# Patient Record
Sex: Male | Born: 1960 | Race: Black or African American | Hispanic: No | Marital: Single
Health system: Southern US, Community
[De-identification: ages and names within clinical notes are randomized; demographics above are authoritative.]

---

## 1997-10-12 ENCOUNTER — Ambulatory Visit (HOSPITAL_COMMUNITY): Admission: RE | Admit: 1997-10-12 | Discharge: 1997-10-12 | Payer: Self-pay | Admitting: Neurosurgery

## 2003-01-21 ENCOUNTER — Emergency Department (HOSPITAL_COMMUNITY): Admission: EM | Admit: 2003-01-21 | Discharge: 2003-01-21 | Payer: Self-pay | Admitting: Emergency Medicine

## 2003-01-31 ENCOUNTER — Emergency Department (HOSPITAL_COMMUNITY): Admission: EM | Admit: 2003-01-31 | Discharge: 2003-01-31 | Payer: Self-pay | Admitting: Emergency Medicine

## 2003-02-02 ENCOUNTER — Encounter: Admission: RE | Admit: 2003-02-02 | Discharge: 2003-02-02 | Payer: Self-pay | Admitting: Occupational Medicine

## 2003-02-02 ENCOUNTER — Encounter: Payer: Self-pay | Admitting: Occupational Medicine

## 2003-02-10 ENCOUNTER — Emergency Department (HOSPITAL_COMMUNITY): Admission: EM | Admit: 2003-02-10 | Discharge: 2003-02-11 | Payer: Self-pay | Admitting: Emergency Medicine

## 2003-05-10 ENCOUNTER — Encounter: Admission: RE | Admit: 2003-05-10 | Discharge: 2003-05-10 | Payer: Self-pay | Admitting: Neurosurgery

## 2003-05-24 ENCOUNTER — Encounter: Admission: RE | Admit: 2003-05-24 | Discharge: 2003-05-24 | Payer: Self-pay | Admitting: Neurosurgery

## 2003-07-13 ENCOUNTER — Ambulatory Visit (HOSPITAL_COMMUNITY): Admission: RE | Admit: 2003-07-13 | Discharge: 2003-07-14 | Payer: Self-pay | Admitting: Neurosurgery

## 2003-12-24 ENCOUNTER — Encounter: Admission: RE | Admit: 2003-12-24 | Discharge: 2003-12-24 | Payer: Self-pay | Admitting: Neurosurgery

## 2004-01-14 ENCOUNTER — Encounter: Admission: RE | Admit: 2004-01-14 | Discharge: 2004-01-14 | Payer: Self-pay | Admitting: Neurosurgery

## 2004-01-31 ENCOUNTER — Encounter: Admission: RE | Admit: 2004-01-31 | Discharge: 2004-01-31 | Payer: Self-pay | Admitting: Neurosurgery

## 2005-06-01 HISTORY — PX: BACK SURGERY: SHX140

## 2005-08-24 ENCOUNTER — Emergency Department (HOSPITAL_COMMUNITY): Admission: EM | Admit: 2005-08-24 | Discharge: 2005-08-24 | Payer: Self-pay | Admitting: Emergency Medicine

## 2005-08-25 ENCOUNTER — Emergency Department (HOSPITAL_COMMUNITY): Admission: EM | Admit: 2005-08-25 | Discharge: 2005-08-26 | Payer: Self-pay | Admitting: Emergency Medicine

## 2005-11-10 ENCOUNTER — Ambulatory Visit (HOSPITAL_BASED_OUTPATIENT_CLINIC_OR_DEPARTMENT_OTHER): Admission: RE | Admit: 2005-11-10 | Discharge: 2005-11-10 | Payer: Self-pay | Admitting: Orthopedic Surgery

## 2005-11-16 ENCOUNTER — Emergency Department (HOSPITAL_COMMUNITY): Admission: EM | Admit: 2005-11-16 | Discharge: 2005-11-16 | Payer: Self-pay | Admitting: *Deleted

## 2006-01-13 ENCOUNTER — Ambulatory Visit (HOSPITAL_COMMUNITY): Admission: RE | Admit: 2006-01-13 | Discharge: 2006-01-13 | Payer: Self-pay | Admitting: Orthopedic Surgery

## 2006-05-18 ENCOUNTER — Encounter (INDEPENDENT_AMBULATORY_CARE_PROVIDER_SITE_OTHER): Payer: Self-pay | Admitting: *Deleted

## 2006-05-18 ENCOUNTER — Inpatient Hospital Stay (HOSPITAL_COMMUNITY): Admission: RE | Admit: 2006-05-18 | Discharge: 2006-05-21 | Payer: Self-pay | Admitting: Orthopedic Surgery

## 2006-08-16 ENCOUNTER — Encounter: Admission: RE | Admit: 2006-08-16 | Discharge: 2006-10-14 | Payer: Self-pay | Admitting: Orthopedic Surgery

## 2006-11-20 ENCOUNTER — Encounter: Admission: RE | Admit: 2006-11-20 | Discharge: 2006-11-20 | Payer: Self-pay | Admitting: Orthopedic Surgery

## 2010-06-21 ENCOUNTER — Encounter: Payer: Self-pay | Admitting: Neurosurgery

## 2010-06-22 ENCOUNTER — Encounter: Payer: Self-pay | Admitting: Orthopedic Surgery

## 2010-10-17 NOTE — Discharge Summary (Signed)
NAME:  Martin Price, Martin Price NO.:  [id number]   MEDICAL RECORD NO.:  [id number]          PATIENT TYPE:  INP   LOCATION:  5005                         FACILITY:  [facility]   PHYSICIAN:  Lianne Cure, P.A.  DATE OF BIRTH:  02/07/1961   DATE OF ADMISSION:  05/18/2006  DATE OF DISCHARGE:  05/21/2006                               DISCHARGE SUMMARY   ADMISSION DIAGNOSIS:  Lumbar spondylosis L4-5, intraforaminal disk  herniation, left side.   POSTOPERATIVE DIAGNOSIS:  Lumbar spondylosis L4-5, intraforaminal disk  herniation, left side.   OPERATIVE PROCEDURE:  L4-5 laminectomy, facet joint resection,  posterolateral fusion L3-4, L4-5, posterior interbody fusion L4-5 with  cage.   BRIEF HISTORY OF STAY:  Patient was brought in to the OR on May 18, 2006, by Dr. Nelda Severe for L3-4, L4-5, posterolateral fusion, PLIF.  Patient tolerated the surgery well.  Estimated blood loss 200 mL.  He  was stable, active range of motion in bilateral lower extremities, and  sensation grossly intact in the postoperative care unit.  At 12:03 p.m.  on May 18, 2006, postop day 1, patient was alert and oriented.  No  shortness of breath reported.  No chest pains, back pains/soreness.  He  was afebrile, and vital signs were stable.  Drain output was 275 mL  total.  Surgical procedure drain was left intact.  Calves were soft,  nontender to palpation, and palpable DT and PT pulses that were equal  bilaterally.  He had on TED hose and SCDs.  Sensation grossly intact and  equal bilaterally.  He said he had decreased leg pain postoperatively on  the left lower extremity.  IV fluids were decreased to hep lock.  He was  given Norco 10/325 every 4 hours.  PCA was discontinued.  He received 2  doses of vancomycin postoperatively dosed by pharmacy.  He had a  physical therapy evaluation for safety ambulation and mobility.  On  postop day 2, he was afebrile.  Vital signs were stable.  Drain was  discontinued; output of 50 mL.  Hemoglobin stable at 13.4.  Bilateral  lower extremities grossly neurovascular and motor intact.  Incision site  is clean and dry.  A dry dressing was applied.  Calves were soft,  nontender to palpation.  He was ambulating with standby assist in the  halls.  Postop day 3, today, he is afebrile.  Vital signs are stable.  White cell count is still slightly elevated.  Incentive spirometry is  encouraged.  Chief complaint is no significant bowel movement, although  he get a Fleet enema and suppository with minimal results.  Today we are  going to give him a dose of mag citrate and have him ambulate.  Plan on  him going home.   DISCHARGE DIAGNOSIS:  Status post L3-4, L4-5 posterolateral interbody  fusion with laminectomy.   DISCHARGE INSTRUCTIONS:  His instructions are to walk for exercise.  He  may shower as tolerated.  We do not want him to do any bending or  stooping and he is going to follow with [place] in the office with Dr.  Casimiro Needle  Tooke in 4 weeks.  If he has any troubles or concerns, he is  welcome to call us.   DISCHARGE MEDICATIONS:  I am going to send him home on prescriptions  for:  1. Norco 10/325 one to two every 4 hours p.r.n. for pain.  2. Robaxin 500 one  every 6 hours for muscle spasms.  3. Colace 100 mg twice daily.  4. Chantix 2 tablets a day to help him stop smoking.   CONDITION ON DISCHARGE:  He is being discharged in stable condition.   DIET:  Regular diet.      Lianne Cure, P.A.     MC/MEDQ  D:  05/21/2006  T:  05/21/2006  Job:  [id number]

## 2010-10-17 NOTE — Op Note (Signed)
NAME:  Martin Price, LEADBETTER NO.:  [id number]   MEDICAL RECORD NO.:  [id number]          PATIENT TYPE:  INP   LOCATION:  5005                         FACILITY:  [facility]   PHYSICIAN:  Nelda Severe, MD      DATE OF BIRTH:  Oct 08, 1960   DATE OF PROCEDURE:  05/18/2006  DATE OF DISCHARGE:                               OPERATIVE REPORT   SURGEON:  Nelda Severe, MD.   ASSISTANT:  Lianne Cure, P.A.-C.   PREOPERATIVE DIAGNOSIS:  Lumbar spondylosis, L4-5 intraforaminal disk  herniation left side.   POSTOPERATIVE DIAGNOSIS:  Lumbar spondylosis, L4-5 intraforaminal disk  herniation left side.   OPERATIVE PROCEDURE:  L4-5 laminectomy and facette joint resection left  side, disk excision (extruded fragment); posterior interbody fusion L4-5  with cage, InFuse and autograft; posterolateral fusion L3-4, L4-5 right  side; insertion of segmental screws and rods L3-4, L4-5 (__________);  harvest of local bone graft.   PROCEDURE:  The patient was placed under general anesthetic.  A gram of  vancomycin was infused intravenously.  A Foley catheter was placed in  the bladder.  Bilateral sequential compression devices were placed on  both lower extremities.   He was positioned prone on the Hessmer frame.  Care was taken to  position the upper extremities so as to avoid hyperflexion and abduction  of the shoulders and so as to avoid any pressure whatsoever on the  cubital tunnels.  Both upper extremities were padded from axilla to  hands with foam.  The hips were relatively extended, the thighs, knees  and shins were supported on pillows.   The lumbar area was prepped with DuraPrep and draped in rectangular  fashion.  The drapes were secured with Ioban.   A midline incision was made from approximately L3-S1.  Paraspinal  muscles were reflected bilaterally using Cobb elevators and cutting  current.  Cross-table lateral radiograph was taken with a Kocher on the  trailing edge of  the lamina of the L4 vertebra which conformed to our  impression of level identification.   We made pedicle holes bilaterally at L3, L4 and L5, in the same fashion  at each level.  Junction of the superior articular process and  transverse process was identified.  A small piece of bone was removed at  the base of the superior articular process.  The pedicle was perforated  with an awl and a 3.5 mm drill bit used to make a hole through the  pedicle into the vertebral body.  There hole was then carefully palpated  with a ball-tip probe to make sure it was circumferentially intact.  The  hole was sounded for depth and the depths recorded.  FloSeal was  injected into each hole and then a radio-opaque marker placed.  Cross-  table lateral radiograph was taken which showed satisfactory position of  the markers.   Next, we performed a left-sided laminectomy at L4-5 carefully harvesting  bone.  Complete facetectomy was performed.  We then removed ligamentum  flavum medially.  The disk herniation could be identified laterally and  the medial edge of the dura and a very  large L5 nerve root.  The nerve  root was retracted medially.  The disk was incised and several  moderately large pieces of degenerated nucleus pulposus were extricated  from deep to the posterior longitudinal ligament, distal to the disk  space itself.   Next, we packed the cottonoid patty subjacent to the exiting L4 nerve  root and just above the L4-5 disk space.  A nerve root  retractor was  used to retract the dura and the L5 nerve root medially.  A large window  was cut with a 15 blade in the posterolateral disk.  We then set about  preparation of the disk space for insertion of a cage eventually.  After  we curetted it multiple times and removed as much disk space as  possible, we then inserted screws bilaterally at S1, compression  bilaterally at L5, L4 and L3.  These were 6.5 mm screws with depths  being dependent on  measurements.   We then decorticated the transverse processes on the right side at L4,  L5 as well as at L3.  We then packed in a burrito made with a BMP soaked  collagen sponge and morsels of bone graft posterolaterally on the right  side.   Subsequent to the screws being inserted, precontoured rod was attached  on the right side and provisionally coupled with distraction placed on  the L4-5 interspace to hold the disk space open.   We then returned to the left side.  We used a 12 mm and then a 13 mm box  chisel to prepare a place for a 13 mm x 11 mm x 24 mm interbody cage.  The implant itself was then packed with a piece of BMP soaked collagen  sponge and impacted into place and countersunk.  Prior to placing the  cage, we packed morselized autograft harvested locally into the disk  space anteriorly.   Next, having placed the screws on the left side, a precontoured rod was  placed.  The screws were loosened off on the right side.   Next, we decorticated the lamina on the right side at L3, L4 and L5 and  carefully resected the articular surfaces of facette joints at L3-4 and  L4-5 on the right side.  We then packed in local bone graft and placed a  strip of BMP soaked collagen sponge superficial to that.  The lamina at  L3-4 was decorticated on the left side and the articular surfaces  removed at the L3-4 facette joint.  More local bone graft and BMP was  packed into this area.   We then torqued the L5 screws bilaterally.  The L4-5 and L3-4 levels  were then torqued after compression of the construct to maximize the  amount of lordosis.   Cross-table lateral radiograph showed satisfactory position of all  screws and the interbody implant.   A Blake drain, 15-gauge, was placed in the deep layer of the wound.  It  was brought out through a stab wound to the right side and secured with  a #2-0 nylon suture in basket weave fashion.  The lumbar fascia was closed using continuous #1  Vicryl suture.  The subcutaneous tissue was  closed using a continuous 2-0 Vicryl suture.  The skin was closed using  continuous subcuticular 3-0 undyed Vicryl suture.  The skin edges were  reinforced with Steri-Strips.  A nonadherent antibiotic ointment  dressing was then applied and secured with OpSite.   The patient was transferred from  the table to the bed and brought to the  postop recovery room where he is currently awake and able to move both  feet and ankles to command.   ESTIMATED BLOOD LOSS:  200 mL.   COMPLICATIONS:  There no intraoperative complications.   Sponge and needle counts were correct.      Nelda Severe, MD  Electronically Signed     MT/MEDQ  D:  05/18/2006  T:  05/18/2006  Job:  [id number]

## 2010-10-17 NOTE — H&P (Signed)
NAME:  Martin Price, Martin Price NO.:  [id number]   MEDICAL RECORD NO.:  [id number]          PATIENT TYPE:  INP   LOCATION:  NA                           FACILITY:  [facility]   PHYSICIAN:  Nelda Severe, MD      DATE OF BIRTH:  10/21/1960   DATE OF ADMISSION:  12/08/2005  DATE OF DISCHARGE:  12/08/2005                              HISTORY & PHYSICAL   CHIEF COMPLAINT:  Lumbar pain, left leg pain, radiculopathy, otherwise  he is healthy.   He has had two previous back surgeries in 2002 and 1998 at [facility].   He has no known drug allergies.   CURRENT MEDICATIONS:  Include:  1. Ambien 10 mg one q.h.s. p.r.n. for sleep.  2. Vicodin 5/500 one to two q.6. p.r.n. for pain control.   FAMILY HISTORY:  His father had hypertension, diabetes and is deceased  secondary to an MI.   REVIEW OF SYSTEMS:  He reports no recent weight loss, weight gain.  He  reports no vision changes, no hearing changes, no hoarseness, no chronic  cough, no difficulty swallowing, no fevers, no chills, no nausea,  vomiting, diarrhea, no frequent or burning urination, no blackouts, no  seizures, no migraines.   VITALS:  On physical exam today, his temperature is 97.6, pulse 78,  respirations 20, blood pressure 89/59.  GENERAL:  He appears well  developed, well nourished, normocephalic.  HEENT:  Pupils are equal, round and reactive to light.  NECK:  Supple, nontender to palpation, full range of motion.  CHEST:  Clear to auscultation, no wheezes noted.  HEART:  Regular rate and rhythm, no murmurs noted.  ABDOMEN:  Soft, nontender to palpation, positive bowel sounds are  auscultated.  EXTREMITIES:  Left leg pain, lumbar pain.  He has slight decreased left  knee jerks compared to the right.  SKIN:  Clean, dry and intact, no rashes noted.   X-rays and MRIs were done, show lumbar degenerative disc.   PLAN:  Posterior lumbar disc excision with fusion at L3-4, 4-5 by Dr.  Nelda Severe.      Lianne Cure, P.A.      Nelda Severe, MD  Electronically Signed    MC/MEDQ  D:  05/14/2006  T:  05/15/2006  Job:  [id number]
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
## 2010-10-17 NOTE — Op Note (Signed)
NAME:  Martin Price, Martin Price                      ACCOUNT NO.:  [id number]   MEDICAL RECORD NO.:  [id number]                   PATIENT TYPE:  OIB   LOCATION:  3039                                 FACILITY:  [facility]   PHYSICIAN:  Kathaleen Maser. Pool, M.D.                 DATE OF BIRTH:  1961/05/17   DATE OF PROCEDURE:  07/13/2003  DATE OF DISCHARGE:                                 OPERATIVE REPORT   SERVICE:  Neurosurgery.   PREOPERATIVE DIAGNOSES:  Right L3-L4 extraforaminal herniated nucleus  pulposus with radiculopathy.   POSTOPERATIVE DIAGNOSIS:  Right L3-L4 extraforaminal herniated nucleus  pulposus with radiculopathy.   PROCEDURE:  Right L3-L4 laminotomy and microdiscectomy.   SURGEON:  Kathaleen Maser. Pool, M.D.   ASSISTANT:  Tia Alert, M.C.   ANESTHESIA:  General endotracheal.   INDICATIONS FOR PROCEDURE:  [name] is a 50 year old male with a  history of right lower extremity pain, paresthesias and weakness consistent  with a right sided L3 radiculopathy which has failed conservative  management.  Workup demonstrates of a moderately large right sided L3-L4  extraforaminal disc herniation with marked impression of the right sided L3  nerve root.  The patient has failed conservative management and presents now  for extraforaminal microdiscectomy in hopes of improving his symptoms.   DESCRIPTION OF PROCEDURE:  The patient was taken to the operating room and  placed on the table in a supine position.  After an adequate level of  anesthesia was achieved, the patient was positioned prone on the Wilson  frame and appropriately padded for operation in the lumbar region.  He was  prepped and draped sterilely.  A skin incision was made overlying the L3-L4  interspace.  This was carried down sharply in the midline.  Subperiosteal  dissection was performed on the right side exposing the lamina, facet  joints, and transverse processes of L3 and L4.  Deep self-retaining  retractors were  placed.  Interoperative x-ray was taken and the level was  confirmed.   An extraforaminal approach was then performed using the high speed drill and  Kerrison rongeurs to remove the lateral aspect of the superior facet of L4  and the inferior facet of L3.  Intertransverse ligament was then elevated  and resected in a piecemeal fashion using Kerrison rongeurs.  The underlying  right sided L3 nerve root and disc herniation were apparent.  The microscope  was brought onto the field and using microdissection of the disc herniation  and overlying nerve root.  The epidural venous plexus coagulated and cut.  A  moderately large free fragment of disc was encountered and dissected free.  This was resected completely using pituitary rongeurs.  All elements of the  disc herniation was completely resected.  The interspace was entered and all  loose disc material was removed.  At this point, a very thorough  extraforaminal microdiscectomy had been performed.  There was no  evidence of  injury to the thecal sac or nerve roots.  There was no evidence of any  residual compression.  The wound was then irrigated with antibiotic  solution, Gelfoam was placed topically and hemostasis was found to be good.  The microscope and retractors were removed.  Hemostasis in the muscle was  achieved with electrocautery.  The wounds  were closed in layers with Vicryl sutures.  Steri-Strips and sterile  dressing were applied.  There were no complications.  The patient tolerated  the procedure well and he was returned to the recovery room in stable  condition.                                               Henry A. Pool, M.D.    HAP/MEDQ  D:  07/13/2003  T:  07/13/2003  Job:  [id number]

## 2011-02-26 ENCOUNTER — Other Ambulatory Visit: Payer: Self-pay | Admitting: Orthopedic Surgery

## 2011-02-26 DIAGNOSIS — M542 Cervicalgia: Secondary | ICD-10-CM

## 2011-02-26 DIAGNOSIS — M545 Low back pain: Secondary | ICD-10-CM

## 2011-02-26 DIAGNOSIS — M79604 Pain in right leg: Secondary | ICD-10-CM

## 2011-02-26 DIAGNOSIS — M25519 Pain in unspecified shoulder: Secondary | ICD-10-CM

## 2011-03-04 ENCOUNTER — Ambulatory Visit
Admission: RE | Admit: 2011-03-04 | Discharge: 2011-03-04 | Disposition: A | Discharge status: Home / Self Care | Payer: Medicare Other | Source: Ambulatory Visit | Attending: Orthopedic Surgery | Admitting: Orthopedic Surgery

## 2011-03-04 DIAGNOSIS — M542 Cervicalgia: Secondary | ICD-10-CM

## 2011-03-04 DIAGNOSIS — M79604 Pain in right leg: Secondary | ICD-10-CM

## 2011-03-04 DIAGNOSIS — M25519 Pain in unspecified shoulder: Secondary | ICD-10-CM

## 2011-03-04 DIAGNOSIS — M545 Low back pain, unspecified: Secondary | ICD-10-CM

## 2011-03-04 MED ORDER — IOHEXOL 300 MG/ML  SOLN
10.0000 mL | Freq: Once | INTRAMUSCULAR | Status: AC | PRN
  Administered 2011-03-04: 10 mL via INTRATHECAL

## 2011-03-04 MED ORDER — MEPERIDINE HCL 100 MG/ML IJ SOLN
75.0000 mg | Freq: Once | INTRAMUSCULAR | Status: AC
  Administered 2011-03-04: 75 mg via INTRAMUSCULAR

## 2011-03-04 MED ORDER — ONDANSETRON HCL 4 MG/2ML IJ SOLN
4.0000 mg | Freq: Once | INTRAMUSCULAR | Status: AC
  Administered 2011-03-04: 4 mg via INTRAMUSCULAR

## 2011-03-04 MED ORDER — DIAZEPAM 5 MG PO TABS
10.0000 mg | ORAL_TABLET | Freq: Once | ORAL | Status: AC
  Administered 2011-03-04: 10 mg via ORAL

## 2011-03-04 NOTE — Progress Notes (Signed)
Pt called ride to come and get him.

## 2012-08-21 ENCOUNTER — Encounter (HOSPITAL_COMMUNITY): Payer: Self-pay | Admitting: *Deleted

## 2012-08-21 ENCOUNTER — Emergency Department (HOSPITAL_COMMUNITY)
Admission: EM | Admit: 2012-08-21 | Discharge: 2012-08-21 | Disposition: A | Discharge status: Home / Self Care | Payer: Medicare Other | Attending: Emergency Medicine | Admitting: Emergency Medicine

## 2012-08-21 ENCOUNTER — Emergency Department (HOSPITAL_COMMUNITY): Payer: Medicare Other

## 2012-08-21 DIAGNOSIS — S91009A Unspecified open wound, unspecified ankle, initial encounter: Secondary | ICD-10-CM | POA: Insufficient documentation

## 2012-08-21 DIAGNOSIS — S39012A Strain of muscle, fascia and tendon of lower back, initial encounter: Secondary | ICD-10-CM

## 2012-08-21 DIAGNOSIS — W540XXA Bitten by dog, initial encounter: Secondary | ICD-10-CM

## 2012-08-21 DIAGNOSIS — Z23 Encounter for immunization: Secondary | ICD-10-CM | POA: Insufficient documentation

## 2012-08-21 DIAGNOSIS — S81851A Open bite, right lower leg, initial encounter: Secondary | ICD-10-CM

## 2012-08-21 DIAGNOSIS — F17200 Nicotine dependence, unspecified, uncomplicated: Secondary | ICD-10-CM | POA: Insufficient documentation

## 2012-08-21 DIAGNOSIS — Y92410 Unspecified street and highway as the place of occurrence of the external cause: Secondary | ICD-10-CM | POA: Insufficient documentation

## 2012-08-21 DIAGNOSIS — Y9301 Activity, walking, marching and hiking: Secondary | ICD-10-CM | POA: Insufficient documentation

## 2012-08-21 DIAGNOSIS — IMO0002 Reserved for concepts with insufficient information to code with codable children: Secondary | ICD-10-CM | POA: Insufficient documentation

## 2012-08-21 DIAGNOSIS — S81009A Unspecified open wound, unspecified knee, initial encounter: Secondary | ICD-10-CM | POA: Insufficient documentation

## 2012-08-21 MED ORDER — CYCLOBENZAPRINE HCL 5 MG PO TABS: 10.0000 mg | ORAL_TABLET | Freq: Two times a day (BID) | ORAL | Status: DC | PRN

## 2012-08-21 MED ORDER — TRAMADOL HCL 50 MG PO TABS: 50.0000 mg | ORAL_TABLET | Freq: Four times a day (QID) | ORAL | Status: DC | PRN

## 2012-08-21 MED ORDER — TETANUS-DIPHTH-ACELL PERTUSSIS 5-2.5-18.5 LF-MCG/0.5 IM SUSP
0.5000 mL | Freq: Once | INTRAMUSCULAR | Status: AC
  Administered 2012-08-21: 0.5 mL via INTRAMUSCULAR
  Filled 2012-08-21: qty 0.5

## 2012-08-21 MED ORDER — RABIES VACCINE, PCEC IM SUSR
1.0000 mL | Freq: Once | INTRAMUSCULAR | Status: AC
  Administered 2012-08-21: 1 mL via INTRAMUSCULAR
  Filled 2012-08-21: qty 1

## 2012-08-21 MED ORDER — RABIES IMMUNE GLOBULIN 150 UNIT/ML IM INJ
20.0000 [IU]/kg | INJECTION | Freq: Once | INTRAMUSCULAR | Status: AC
  Administered 2012-08-21: 1425 [IU] via INTRAMUSCULAR
  Filled 2012-08-21: qty 9.5

## 2012-08-21 NOTE — ED Notes (Signed)
Bacitracin and band-aid placed on wound to R leg.

## 2012-08-21 NOTE — ED Provider Notes (Signed)
History    This chart was scribed for non-physician practitioner working with Richardean Canal, MD by Donne Anon, ED Scribe. This patient was seen in room WTR6/WTR6 and the patient's care was started at 2024.   CSN: [id number]  Arrival date & time 08/21/12  1909   First MD Initiated Contact with Patient 08/21/12 2024      Chief Complaint  Patient presents with  . Animal Bite     The history is provided by the patient. No language interpreter was used.   Martin Price is a 52 y.o. male who presents to the Emergency Department complaining of sudden onset, constant, gradually worsening severe left leg pain due to a dog bite from a neighborhood dog which occurred while he was out for a walk. He states he hurt his lower right back while he was fighting off the dog. He is not sure if the dog is UTD on his vacinnes. He has not tried any OTC pain mediation.   He is unsure when his last tetanus shot was.  History reviewed. No pertinent past medical history.  Past Surgical History  Procedure Laterality Date  . Back surgery      No family history on file.  History  Substance Use Topics  . Smoking status: Current Every Day Smoker    Types: Cigarettes  . Smokeless tobacco: Not on file  . Alcohol Use: No      Review of Systems  Constitutional: Negative for fever.  Gastrointestinal: Negative for vomiting.  Skin: Positive for wound.  All other systems reviewed and are negative.    Allergies  Review of patient's allergies indicates no known allergies.  Home Medications   Current Outpatient Rx  Name  Route  Sig  Dispense  Refill  . naproxen sodium (ANAPROX) 220 MG tablet   Oral   Take 220 mg by mouth 2 (two) times daily as needed.         . cyclobenzaprine (FLEXERIL) 5 MG tablet   Oral   Take 2 tablets (10 mg total) by mouth 2 (two) times daily as needed for muscle spasms.   20 tablet   0   . traMADol (ULTRAM) 50 MG tablet   Oral   Take 1 tablet (50 mg total) by  mouth every 6 (six) hours as needed for pain.   15 tablet   0     BP 122/76  Pulse 65  Temp(Src) 97.7 F (36.5 C) (Oral)  Resp 18  Wt 156 lb (70.761 kg)  SpO2 99%  Physical Exam  Nursing note and vitals reviewed. Constitutional: He is oriented to person, place, and time. He appears well-developed and well-nourished. No distress.  HENT:  Head: Normocephalic and atraumatic.  Eyes: EOM are normal.  Neck: Neck supple. No tracheal deviation present.  Cardiovascular: Normal rate.   Pulmonary/Chest: Effort normal. No respiratory distress.  Musculoskeletal: Normal range of motion.  3 small abrasions to right shin with minimal subcutaneous disruption. No swelling of the calf. No obvious puncture wounds.  Neurological: He is alert and oriented to person, place, and time.  Skin: Skin is warm and dry.  Psychiatric: He has a normal mood and affect. His behavior is normal.    ED Course  Procedures (including critical care time) DIAGNOSTIC STUDIES: Oxygen Saturation is 99% on room air, normal by my interpretation.    COORDINATION OF CARE: 8:26 PM Discussed treatment plan which includes tetanus vaccine, rabies vaccine, and back x-ray with pt at bedside and  pt agreed to plan.     Labs Reviewed - No data to display Dg Lumbar Spine Complete  08/21/2012  *RADIOLOGY REPORT*  Clinical Data: Back pain.  LUMBAR SPINE - COMPLETE 4+ VIEW  Comparison: CT scan 03/04/2011.  Findings: Lumbar fusion at L3-4 and L4-5 with pedicle screws, posterior rods and an interbody bone spacer at L4-5.  No complicating features are demonstrated.  There are stable advanced degenerative disc disease changes at L1-2 and L2-3.  No acute bony findings.  The visualized bony pelvis is intact.  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  IMPRESSION: Stable fusion hardware. No acute bony findings.   Original Report Authenticated By: Rudie Meyer, M.D.      1. Dog bite of lower leg, right,  initial encounter   2. Back sprain or strain, initial encounter       MDM  Pt has been advised of the symptoms that warrant their return to the ED. Patient has voiced understanding and has agreed to follow-up with the PCP or specialist.  I personally performed the services described in this documentation, which was scribed in my presence. The recorded information has been reviewed and is accurate.      Dorthula Matas, PA-C 08/21/12 2353

## 2012-08-21 NOTE — ED Notes (Signed)
Pt ambulatory to exam room with steady gait.  

## 2012-08-21 NOTE — ED Notes (Signed)
Rabies schedule faxed to pharmacy and urgent care

## 2012-08-21 NOTE — ED Notes (Signed)
Pt walking down street and dog run up to him and started biting his lower leg; hurt back while trying to fight off dog; scrapes noted to right lower leg; stray dog

## 2012-08-24 ENCOUNTER — Encounter (HOSPITAL_COMMUNITY): Payer: Self-pay | Admitting: Emergency Medicine

## 2012-08-24 ENCOUNTER — Emergency Department (INDEPENDENT_AMBULATORY_CARE_PROVIDER_SITE_OTHER)
Admission: EM | Admit: 2012-08-24 | Discharge: 2012-08-24 | Disposition: A | Discharge status: Home / Self Care | Payer: Medicare Other | Source: Home / Self Care

## 2012-08-24 DIAGNOSIS — Z203 Contact with and (suspected) exposure to rabies: Secondary | ICD-10-CM

## 2012-08-24 MED ORDER — RABIES VACCINE, PCEC IM SUSR
INTRAMUSCULAR | Status: AC
  Filled 2012-08-24: qty 1

## 2012-08-24 MED ORDER — RABIES VACCINE, PCEC IM SUSR
1.0000 mL | Freq: Once | INTRAMUSCULAR | Status: AC
  Administered 2012-08-24: 1 mL via INTRAMUSCULAR

## 2012-08-24 NOTE — ED Notes (Signed)
Pt is here for 2nd rabies vaccination Denies any new medical problems  He is alert and oriented w/no signs of acute distress.

## 2012-08-24 NOTE — ED Provider Notes (Signed)
Medical screening examination/treatment/procedure(s) were performed by non-physician practitioner and as supervising physician I was immediately available for consultation/collaboration.   Dimitria Ketchum H Tyshan Enderle, MD 08/24/12 1542 

## 2012-08-28 ENCOUNTER — Encounter (HOSPITAL_COMMUNITY): Payer: Self-pay | Admitting: *Deleted

## 2012-08-28 ENCOUNTER — Emergency Department (INDEPENDENT_AMBULATORY_CARE_PROVIDER_SITE_OTHER)
Admission: EM | Admit: 2012-08-28 | Discharge: 2012-08-28 | Disposition: A | Discharge status: Home / Self Care | Payer: Medicare Other | Source: Home / Self Care | Attending: Family Medicine | Admitting: Family Medicine

## 2012-08-28 DIAGNOSIS — Z5189 Encounter for other specified aftercare: Secondary | ICD-10-CM

## 2012-08-28 DIAGNOSIS — S39012D Strain of muscle, fascia and tendon of lower back, subsequent encounter: Secondary | ICD-10-CM

## 2012-08-28 DIAGNOSIS — M5431 Sciatica, right side: Secondary | ICD-10-CM

## 2012-08-28 DIAGNOSIS — Z203 Contact with and (suspected) exposure to rabies: Secondary | ICD-10-CM

## 2012-08-28 DIAGNOSIS — M5430 Sciatica, unspecified side: Secondary | ICD-10-CM

## 2012-08-28 MED ORDER — RABIES VACCINE, PCEC IM SUSR
1.0000 mL | Freq: Once | INTRAMUSCULAR | Status: AC
  Administered 2012-08-28: 1 mL via INTRAMUSCULAR

## 2012-08-28 MED ORDER — PREDNISONE 20 MG PO TABS: 20.0000 mg | ORAL_TABLET | Freq: Every day | ORAL | Status: DC

## 2012-08-28 MED ORDER — RABIES VACCINE, PCEC IM SUSR
INTRAMUSCULAR | Status: AC
  Filled 2012-08-28: qty 1

## 2012-08-28 MED ORDER — HYDROCODONE-ACETAMINOPHEN 5-325 MG PO TABS: 1.0000 | ORAL_TABLET | ORAL | Status: DC | PRN

## 2012-08-28 MED ORDER — CYCLOBENZAPRINE HCL 10 MG PO TABS: 10.0000 mg | ORAL_TABLET | Freq: Three times a day (TID) | ORAL | Status: DC | PRN

## 2012-08-28 NOTE — ED Notes (Addendum)
Here for 3rd rabies vaccine.  He also states he needs refill of Cyclobenzaprine and Tramadol. Dog bite to R ankle 3/23 appears to be healing.  He said he twisted his back trying to get away. C/o severe spasms in his back.

## 2012-08-28 NOTE — ED Provider Notes (Signed)
History     CSN: [id number]  Arrival date & time 08/28/12  1515   First MD Initiated Contact with Patient 08/28/12 1553      Chief Complaint  Patient presents with  . Rabies Injection     Patient is a 52 y.o. male presenting with back pain. The history is provided by the patient.  Back Pain Location:  Sacro-iliac joint and lumbar spine Quality:  Shooting Radiates to:  L posterior upper leg Pain severity:  Severe Pain is:  Same all the time Onset quality:  Gradual Timing:  Constant Progression:  Unchanged Chronicity:  New Context: recent injury   Relieved by:  Muscle relaxants Worsened by:  Bending, movement, standing and twisting Associated symptoms: leg pain   Associated symptoms: no bladder incontinence, no bowel incontinence, no dysuria, no fever, no numbness, no paresthesias, no perianal numbness, no tingling and no weakness   Pt presents for one of his rabies injections. During this visit states that he was recently attacked by a dog on 08/21/2012. During this attack he made a violent twisting movement in his attempt to get away from the dog and "wrenched his back" He was xrayed in the ED that night and xrays were normal. He has continued to have lower back pain R>L. Has been taking muscle relaxers and Tramadol for the pain. But now pain radiates into his (R) upper (anterolateral) thigh and he needs a refill on the muscle relaxers and something a little stronger for the pain.   History reviewed. No pertinent past medical history.  Past Surgical History  Procedure Laterality Date  . Back surgery  2007    fusion    History reviewed. No pertinent family history.  History  Substance Use Topics  . Smoking status: Current Every Day Smoker -- 0.20 packs/day    Types: Cigarettes  . Smokeless tobacco: Not on file  . Alcohol Use: No      Review of Systems  Constitutional: Negative for fever.  Gastrointestinal: Negative for bowel incontinence.  Genitourinary: Negative  for bladder incontinence and dysuria.  Musculoskeletal: Positive for back pain.  Neurological: Negative for tingling, weakness, numbness and paresthesias.  All other systems reviewed and are negative.    Allergies  Review of patient's allergies indicates no known allergies.  Home Medications   Current Outpatient Rx  Name  Route  Sig  Dispense  Refill  . cyclobenzaprine (FLEXERIL) 5 MG tablet   Oral   Take 2 tablets (10 mg total) by mouth 2 (two) times daily as needed for muscle spasms.   20 tablet   0   . traMADol (ULTRAM) 50 MG tablet   Oral   Take 1 tablet (50 mg total) by mouth every 6 (six) hours as needed for pain.   15 tablet   0   . naproxen sodium (ANAPROX) 220 MG tablet   Oral   Take 220 mg by mouth 2 (two) times daily as needed.           BP 111/74  Pulse 77  Temp(Src) 98.1 F (36.7 C) (Oral)  Resp 18  SpO2 100%  Physical Exam  Nursing note and vitals reviewed. Constitutional: He is oriented to person, place, and time. He appears well-developed and well-nourished.  HENT:  Head: Normocephalic and atraumatic.  Eyes: Conjunctivae are normal.  Neck: Neck supple. No spinous process tenderness present.  Cardiovascular: Normal rate.   Pulmonary/Chest: Effort normal.  Neurological: He is alert and oriented to person, place, and time. He  has normal strength and normal reflexes.  Skin: Skin is warm and dry.  Psychiatric: He has a normal mood and affect.    ED Course  Procedures (including critical care time)  Labs Reviewed - No data to display No results found.   No diagnosis found.    MDM  Pt here for c/o persistent LBP R>L that now radiates into his (R) upper leg since being attacked by a dog on 08/21/2012. Hx and PE c/w sciatica (R). No focal neurological sx's. Will treat w/ Prednisone, Flexeril and a short course of medication for pain. Pt will be returning here on 09/04/2012 to complete his rabies shot series. Encouraged to report if symptoms have  not improved as he may need an ortho referral.         Leanne Chang, NP 08/30/12 0009

## 2012-09-01 NOTE — ED Provider Notes (Signed)
Medical screening examination/treatment/procedure(s) were performed by resident physician or non-physician practitioner and as supervising physician I was immediately available for consultation/collaboration.   Barkley Bruns MD.   Linna Hoff, MD 09/01/12 [phone]

## 2012-09-04 ENCOUNTER — Emergency Department (INDEPENDENT_AMBULATORY_CARE_PROVIDER_SITE_OTHER)
Admission: EM | Admit: 2012-09-04 | Discharge: 2012-09-04 | Disposition: A | Discharge status: Home / Self Care | Payer: Medicare Other | Source: Home / Self Care | Attending: Emergency Medicine | Admitting: Emergency Medicine

## 2012-09-04 ENCOUNTER — Encounter (HOSPITAL_COMMUNITY): Payer: Self-pay | Admitting: Emergency Medicine

## 2012-09-04 DIAGNOSIS — Z203 Contact with and (suspected) exposure to rabies: Secondary | ICD-10-CM

## 2012-09-04 DIAGNOSIS — M549 Dorsalgia, unspecified: Secondary | ICD-10-CM

## 2012-09-04 MED ORDER — RABIES VACCINE, PCEC IM SUSR
INTRAMUSCULAR | Status: AC
  Filled 2012-09-04: qty 1

## 2012-09-04 MED ORDER — RABIES VACCINE, PCEC IM SUSR
1.0000 mL | Freq: Once | INTRAMUSCULAR | Status: AC
  Administered 2012-09-04: 1 mL via INTRAMUSCULAR

## 2012-09-04 NOTE — ED Provider Notes (Signed)
Medical screening examination/treatment/procedure(s) were performed by non-physician practitioner and as supervising physician I was immediately available for consultation/collaboration.  Takyla Kuchera, M.D.  Taylan Marez C Djon Tith, MD 09/04/12 1840 

## 2012-09-04 NOTE — ED Notes (Signed)
Patient requesting referral for physician to see him about back pain.  Back pain sustained during dog bite incident.  Pain is lower back, mainly left.

## 2012-09-04 NOTE — ED Notes (Signed)
Notified Martin Price that patient has complaints beyond rabies injection.  Requesting provider to examine patient.

## 2012-09-04 NOTE — ED Provider Notes (Signed)
History     CSN: [id number]  Arrival date & time 09/04/12  1308   First MD Initiated Contact with Patient 09/04/12 1647      Chief Complaint  Patient presents with  . Rabies Injection    (Consider location/radiation/quality/duration/timing/severity/associated sxs/prior treatment) Patient is a 52 y.o. male presenting with back pain. The history is provided by the patient. No language interpreter was used.  Back Pain Location:  Lumbar spine Quality:  Aching Radiates to:  Does not radiate Pain severity:  Moderate Pain is:  Worse during the day Timing:  Constant Progression:  Worsening Chronicity:  New Relieved by:  Nothing  Pt reports he continues to have back pain.  Pt here forr rabies vaccine.  Pt has had problems in the past but worse since twisting when he was bit by a dog.  Pt request referral to Orthopaedist History reviewed. No pertinent past medical history.  Past Surgical History  Procedure Laterality Date  . Back surgery  2007    fusion    No family history on file.  History  Substance Use Topics  . Smoking status: Current Every Day Smoker -- 0.20 packs/day    Types: Cigarettes  . Smokeless tobacco: Not on file  . Alcohol Use: No      Review of Systems  Musculoskeletal: Positive for back pain.    Allergies  Review of patient's allergies indicates no known allergies.  Home Medications   Current Outpatient Rx  Name  Route  Sig  Dispense  Refill  . cyclobenzaprine (FLEXERIL) 10 MG tablet   Oral   Take 1 tablet (10 mg total) by mouth 3 (three) times daily as needed for muscle spasms.   20 tablet   0   . cyclobenzaprine (FLEXERIL) 5 MG tablet   Oral   Take 2 tablets (10 mg total) by mouth 2 (two) times daily as needed for muscle spasms.   20 tablet   0   . HYDROcodone-acetaminophen (NORCO/VICODIN) 5-325 MG per tablet   Oral   Take 1 tablet by mouth every 4 (four) hours as needed for pain.   10 tablet   0   . naproxen sodium (ANAPROX) 220  MG tablet   Oral   Take 220 mg by mouth 2 (two) times daily as needed.         . predniSONE (DELTASONE) 20 MG tablet   Oral   Take 1 tablet (20 mg total) by mouth daily. Take 3 tabs on day 1 and 2 tabs on days 2-7   15 tablet   0   . traMADol (ULTRAM) 50 MG tablet   Oral   Take 1 tablet (50 mg total) by mouth every 6 (six) hours as needed for pain.   15 tablet   0     BP 98/63  Pulse 82  Temp(Src) 97.8 F (36.6 C) (Oral)  Resp 18  SpO2 97%  Physical Exam  Nursing note and vitals reviewed. Constitutional: He appears well-developed and well-nourished.  HENT:  Head: Normocephalic.  Cardiovascular: Normal rate.   Pulmonary/Chest: Effort normal.  Abdominal: Soft.  Musculoskeletal: He exhibits tenderness.  Tender ls spine from  nv and ns intact  Neurological: He is alert.  Skin: Skin is warm.    ED Course  Procedures (including critical care time)  Labs Reviewed - No data to display No results found.   1. Back pain       MDM  Follow up with Dr. Shelle Iron  Lonia Skinner [place], PA-C 09/04/12 1655

## 2012-09-04 NOTE — ED Notes (Signed)
Reports tingling in right thigh at times

## 2012-09-23 ENCOUNTER — Telehealth (HOSPITAL_COMMUNITY): Payer: Self-pay | Admitting: *Deleted

## 2012-09-23 NOTE — ED Notes (Signed)
Pt. called on VM with question about his rabies shots. I called pt. back and he asked how much the total bill was. I told him I did not know. I referred him to pt. Accounting @ [phone] or [phone] (P-Z).  I told him if those numbers don't work call the operator @ [phone] and she could connect him.  He also asked if his referral to the orthopedist is still good. I told him it should still be good. Vassie Moselle 09/23/2012

## 2013-06-23 ENCOUNTER — Emergency Department (HOSPITAL_COMMUNITY): Payer: No Typology Code available for payment source

## 2013-06-23 ENCOUNTER — Encounter (HOSPITAL_COMMUNITY): Payer: Self-pay | Admitting: Emergency Medicine

## 2013-06-23 ENCOUNTER — Emergency Department (HOSPITAL_COMMUNITY)
Admission: EM | Admit: 2013-06-23 | Discharge: 2013-06-23 | Disposition: A | Discharge status: Home / Self Care | Payer: No Typology Code available for payment source | Attending: Emergency Medicine | Admitting: Emergency Medicine

## 2013-06-23 DIAGNOSIS — F17200 Nicotine dependence, unspecified, uncomplicated: Secondary | ICD-10-CM | POA: Insufficient documentation

## 2013-06-23 DIAGNOSIS — S161XXA Strain of muscle, fascia and tendon at neck level, initial encounter: Secondary | ICD-10-CM

## 2013-06-23 DIAGNOSIS — S39012A Strain of muscle, fascia and tendon of lower back, initial encounter: Secondary | ICD-10-CM

## 2013-06-23 DIAGNOSIS — Y92410 Unspecified street and highway as the place of occurrence of the external cause: Secondary | ICD-10-CM | POA: Insufficient documentation

## 2013-06-23 DIAGNOSIS — S0990XA Unspecified injury of head, initial encounter: Secondary | ICD-10-CM

## 2013-06-23 DIAGNOSIS — S40019A Contusion of unspecified shoulder, initial encounter: Secondary | ICD-10-CM | POA: Insufficient documentation

## 2013-06-23 DIAGNOSIS — S40012A Contusion of left shoulder, initial encounter: Secondary | ICD-10-CM

## 2013-06-23 DIAGNOSIS — S335XXA Sprain of ligaments of lumbar spine, initial encounter: Secondary | ICD-10-CM | POA: Insufficient documentation

## 2013-06-23 DIAGNOSIS — Z981 Arthrodesis status: Secondary | ICD-10-CM | POA: Insufficient documentation

## 2013-06-23 DIAGNOSIS — Y9389 Activity, other specified: Secondary | ICD-10-CM | POA: Insufficient documentation

## 2013-06-23 DIAGNOSIS — S139XXA Sprain of joints and ligaments of unspecified parts of neck, initial encounter: Secondary | ICD-10-CM | POA: Insufficient documentation

## 2013-06-23 MED ORDER — OXYCODONE-ACETAMINOPHEN 5-325 MG PO TABS: 2.0000 | ORAL_TABLET | ORAL | Status: AC | PRN

## 2013-06-23 MED ORDER — HYDROMORPHONE HCL PF 1 MG/ML IJ SOLN
2.0000 mg | Freq: Once | INTRAMUSCULAR | Status: AC
  Administered 2013-06-23: 2 mg via INTRAMUSCULAR
  Filled 2013-06-23: qty 2

## 2013-06-23 MED ORDER — HYDROCODONE-ACETAMINOPHEN 5-325 MG PO TABS
2.0000 | ORAL_TABLET | Freq: Once | ORAL | Status: AC
  Administered 2013-06-23: 2 via ORAL
  Filled 2013-06-23: qty 2
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
## 2013-06-23 NOTE — ED Notes (Signed)
When this RN walked into the room, pt speaking calmly, in full sentences on the phone.
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
## 2013-06-23 NOTE — ED Notes (Signed)
Pt returned from radiology.

## 2013-06-28 ENCOUNTER — Telehealth (HOSPITAL_COMMUNITY): Payer: Self-pay | Admitting: *Deleted

## 2013-06-28 NOTE — ED Notes (Signed)
Patient called to voice compliant NW:GNFAre:care received in ED. Stated he should have received a soft collar and Rx for muscle spams. Upon review of chart Rx for pain med was given no Rx for muscle spams noted. States he heard the MD say this to the RN and he would continue to f/u even if it meant bringing the news in on it. Service experience # shared with patient. Informed patient that if he was experiencing any change that we would welcome him back for f/u. Patient was pleasant however,stated there are issues that someone needs to hear. Again encourage use of pt experience #.

## 2013-07-04 ENCOUNTER — Ambulatory Visit: Payer: Medicare Other | Admitting: Physical Therapy

## 2013-07-06 ENCOUNTER — Ambulatory Visit: Payer: Medicare Other

## 2013-07-06 ENCOUNTER — Ambulatory Visit: Payer: Medicare Other | Attending: Orthopaedic Surgery

## 2013-07-06 DIAGNOSIS — R293 Abnormal posture: Secondary | ICD-10-CM | POA: Insufficient documentation

## 2013-07-06 DIAGNOSIS — R5381 Other malaise: Secondary | ICD-10-CM | POA: Insufficient documentation

## 2013-07-06 DIAGNOSIS — M2550 Pain in unspecified joint: Secondary | ICD-10-CM | POA: Insufficient documentation

## 2013-07-06 DIAGNOSIS — M2560 Stiffness of unspecified joint, not elsewhere classified: Secondary | ICD-10-CM | POA: Insufficient documentation

## 2013-07-17 ENCOUNTER — Ambulatory Visit: Payer: Medicare Other | Admitting: Rehabilitation

## 2013-07-19 ENCOUNTER — Encounter: Payer: Medicare Other | Admitting: Rehabilitation

## 2013-07-24 ENCOUNTER — Ambulatory Visit: Payer: Medicare Other | Admitting: Rehabilitation

## 2013-07-26 ENCOUNTER — Encounter: Payer: Medicare Other | Admitting: Rehabilitation

## 2013-08-01 ENCOUNTER — Encounter: Payer: Medicare Other | Admitting: Physical Therapy

## 2013-08-03 ENCOUNTER — Encounter: Payer: Medicare Other | Admitting: Physical Therapy

## 2013-08-08 ENCOUNTER — Ambulatory Visit: Payer: Medicare Other | Attending: Orthopaedic Surgery | Admitting: Physical Therapy

## 2013-08-08 DIAGNOSIS — M2550 Pain in unspecified joint: Secondary | ICD-10-CM | POA: Insufficient documentation

## 2013-08-08 DIAGNOSIS — M2560 Stiffness of unspecified joint, not elsewhere classified: Secondary | ICD-10-CM | POA: Insufficient documentation

## 2013-08-08 DIAGNOSIS — R5381 Other malaise: Secondary | ICD-10-CM | POA: Insufficient documentation

## 2013-08-08 DIAGNOSIS — R293 Abnormal posture: Secondary | ICD-10-CM | POA: Insufficient documentation

## 2013-08-10 ENCOUNTER — Ambulatory Visit: Payer: Medicare Other | Admitting: Physical Therapy

## 2013-08-15 ENCOUNTER — Encounter: Payer: Medicare Other | Admitting: Physical Therapy

## 2013-08-17 ENCOUNTER — Ambulatory Visit: Payer: Medicare Other | Admitting: Physical Therapy

## 2013-08-22 ENCOUNTER — Ambulatory Visit: Payer: Medicare Other | Admitting: Rehabilitation

## 2013-08-22 ENCOUNTER — Encounter: Payer: Medicare Other | Admitting: Physical Therapy

## 2013-08-24 ENCOUNTER — Ambulatory Visit: Payer: Medicare Other | Admitting: Rehabilitation

## 2013-08-24 ENCOUNTER — Encounter: Payer: Medicare Other | Admitting: Rehabilitation

## 2013-08-28 ENCOUNTER — Encounter: Payer: Medicare Other | Admitting: Rehabilitation

## 2013-08-28 ENCOUNTER — Ambulatory Visit: Payer: Medicare Other | Admitting: Rehabilitation

## 2013-08-29 ENCOUNTER — Encounter: Payer: Medicare Other | Admitting: Physical Therapy

## 2013-08-31 ENCOUNTER — Encounter: Payer: Medicare Other | Admitting: Rehabilitation

## 2013-08-31 ENCOUNTER — Ambulatory Visit: Payer: Medicare Other | Attending: Orthopaedic Surgery | Admitting: Rehabilitation

## 2013-08-31 DIAGNOSIS — R293 Abnormal posture: Secondary | ICD-10-CM | POA: Insufficient documentation

## 2013-08-31 DIAGNOSIS — M2560 Stiffness of unspecified joint, not elsewhere classified: Secondary | ICD-10-CM | POA: Insufficient documentation

## 2013-08-31 DIAGNOSIS — M2550 Pain in unspecified joint: Secondary | ICD-10-CM | POA: Insufficient documentation

## 2013-08-31 DIAGNOSIS — R5381 Other malaise: Secondary | ICD-10-CM | POA: Insufficient documentation

## 2013-09-05 ENCOUNTER — Ambulatory Visit: Payer: Medicare Other | Admitting: Rehabilitation

## 2013-09-07 ENCOUNTER — Ambulatory Visit: Payer: Medicare Other | Admitting: Physical Therapy

## 2013-09-11 ENCOUNTER — Ambulatory Visit: Payer: Medicare Other | Admitting: Physical Therapy

## 2013-09-13 ENCOUNTER — Ambulatory Visit: Payer: Medicare Other | Admitting: Physical Therapy

## 2013-10-03 ENCOUNTER — Other Ambulatory Visit: Payer: Self-pay | Admitting: Physician Assistant

## 2013-10-03 DIAGNOSIS — M79603 Pain in arm, unspecified: Secondary | ICD-10-CM

## 2013-10-10 ENCOUNTER — Other Ambulatory Visit: Payer: Medicare Other

## 2013-10-13 ENCOUNTER — Other Ambulatory Visit: Payer: Medicare Other

## 2013-10-16 ENCOUNTER — Ambulatory Visit: Payer: Medicare Other | Attending: Orthopaedic Surgery

## 2013-10-16 DIAGNOSIS — R5381 Other malaise: Secondary | ICD-10-CM | POA: Insufficient documentation

## 2013-10-16 DIAGNOSIS — R293 Abnormal posture: Secondary | ICD-10-CM | POA: Insufficient documentation

## 2013-10-16 DIAGNOSIS — M2550 Pain in unspecified joint: Secondary | ICD-10-CM | POA: Insufficient documentation

## 2013-10-16 DIAGNOSIS — M2560 Stiffness of unspecified joint, not elsewhere classified: Secondary | ICD-10-CM | POA: Insufficient documentation

## 2013-10-17 ENCOUNTER — Ambulatory Visit: Payer: Medicare Other

## 2013-10-18 ENCOUNTER — Other Ambulatory Visit: Payer: Medicare Other

## 2013-10-30 ENCOUNTER — Encounter: Payer: Medicare Other | Admitting: Physical Therapy

## 2013-11-06 ENCOUNTER — Encounter: Payer: Medicare Other | Admitting: Physical Therapy

## 2014-05-30 ENCOUNTER — Other Ambulatory Visit: Payer: Self-pay | Admitting: Orthopaedic Surgery

## 2014-05-30 DIAGNOSIS — M79605 Pain in left leg: Principal | ICD-10-CM

## 2014-05-30 DIAGNOSIS — Z981 Arthrodesis status: Secondary | ICD-10-CM

## 2014-05-30 DIAGNOSIS — M79604 Pain in right leg: Secondary | ICD-10-CM

## 2014-06-07 ENCOUNTER — Ambulatory Visit
Admission: RE | Admit: 2014-06-07 | Discharge: 2014-06-07 | Disposition: A | Discharge status: Home / Self Care | Payer: Medicare Other | Source: Ambulatory Visit | Attending: Orthopaedic Surgery | Admitting: Orthopaedic Surgery

## 2014-06-07 DIAGNOSIS — M79604 Pain in right leg: Secondary | ICD-10-CM

## 2014-06-07 DIAGNOSIS — Z981 Arthrodesis status: Secondary | ICD-10-CM

## 2014-06-07 DIAGNOSIS — M79605 Pain in left leg: Principal | ICD-10-CM

## 2014-06-07 MED ORDER — IOHEXOL 180 MG/ML  SOLN
15.0000 mL | Freq: Once | INTRAMUSCULAR | Status: AC | PRN
  Administered 2014-06-07: 15 mL via INTRATHECAL

## 2014-06-07 MED ORDER — DIAZEPAM 5 MG PO TABS
10.0000 mg | ORAL_TABLET | Freq: Once | ORAL | Status: AC
  Administered 2014-06-07: 10 mg via ORAL

## 2014-06-07 MED ORDER — ONDANSETRON HCL 4 MG/2ML IJ SOLN: 4.0000 mg | Freq: Four times a day (QID) | INTRAMUSCULAR | Status: DC | PRN

## 2014-06-07 NOTE — Discharge Instructions (Signed)

## 2015-03-19 IMAGING — CT CT L SPINE W/ CM
4 of 12 series · 11 of 33 positions shown, 12 images · non-contrast
Comparison: 03/04/2011 lumbar CT myelogram

CLINICAL DATA: Status post lumbar spinal fusion with right greater
the left lower extremity pain/numbness. Right-sided symptoms
involves the posterior thigh and lateral shin.
TECHNIQUE: Contiguous axial images were obtained through the Lumbar spine after
the intrathecal infusion of contrast. Coronal and sagittal
reconstructions were obtained of the axial image sets.

[Series 2: l spine bone · axial · 0.27mm/px · z∈[-334,-71]mm · 3 of 106 slices shown, 4 images]
[im 1/106  soft-tissue]
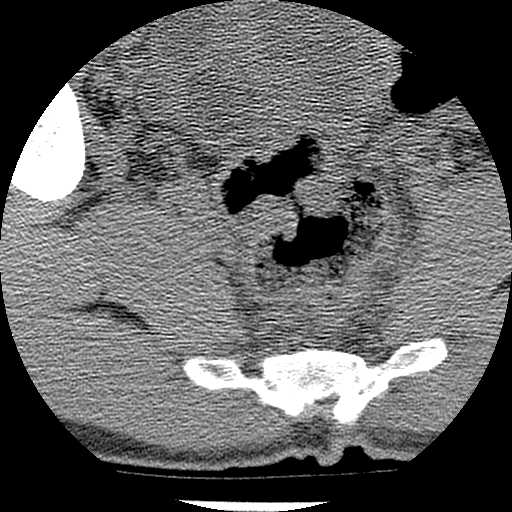
[im 1/106  bone]
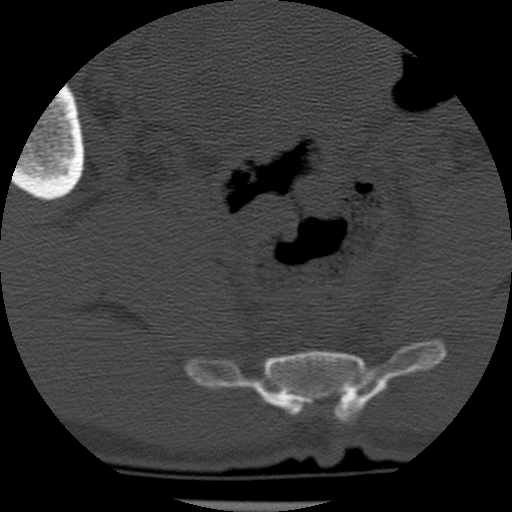
[im 53/106  bone]
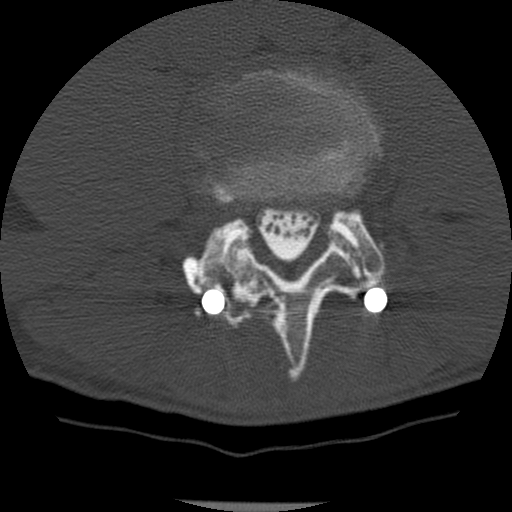
[im 106/106  bone]
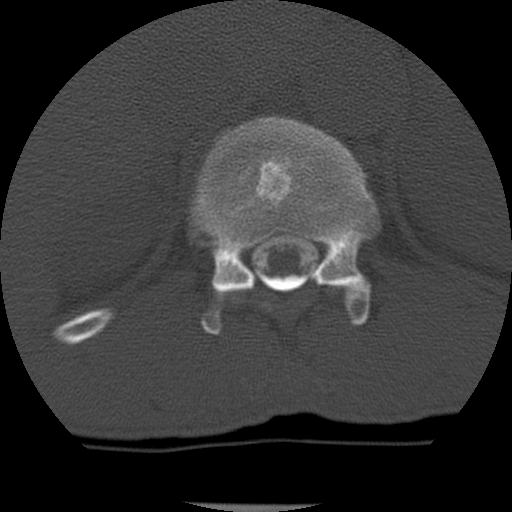

[Series 3: l spine soft · axial · 0.27mm/px · z∈[-246,-159]mm · 2 of 106 slices shown]
[im 36/106  soft-tissue]
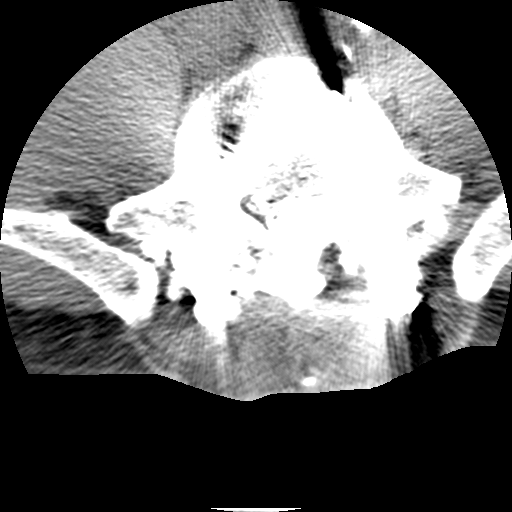
[im 71/106  soft-tissue]
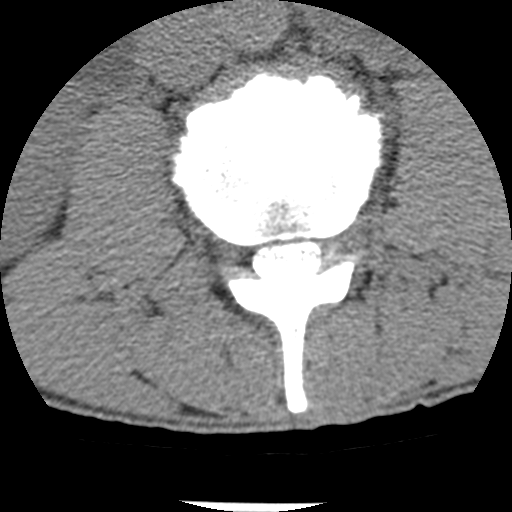

[Series 402: cor lower · coronal · 0.52mm/px · 1 of 60 slices shown]
[im 30/60  bone]
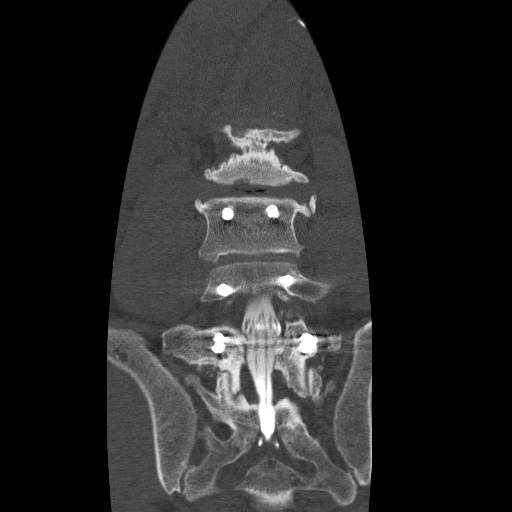

[Series 403: sag · sagittal · 0.52mm/px · 5 of 60 slices shown]
[im 10/60  bone]
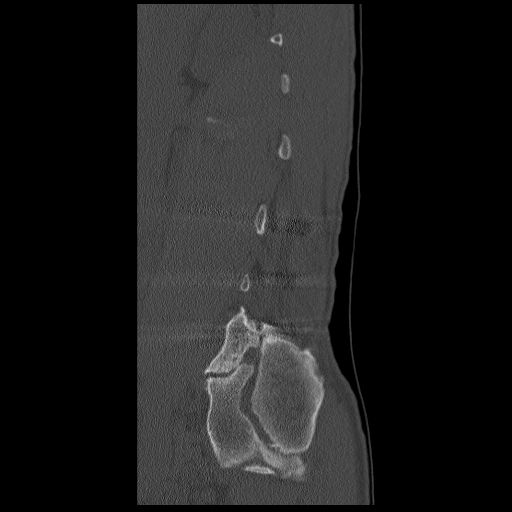
[im 20/60  bone]
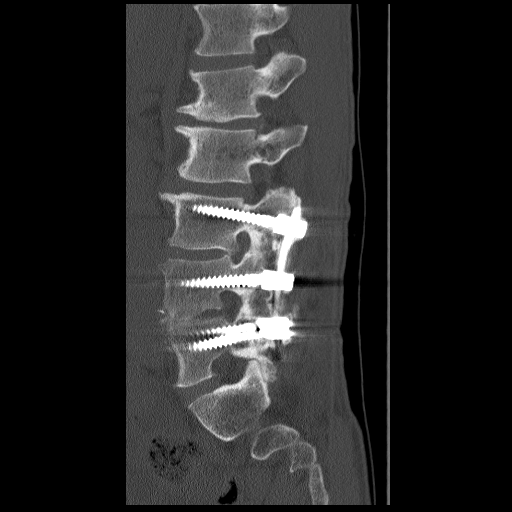
[im 30/60  bone]
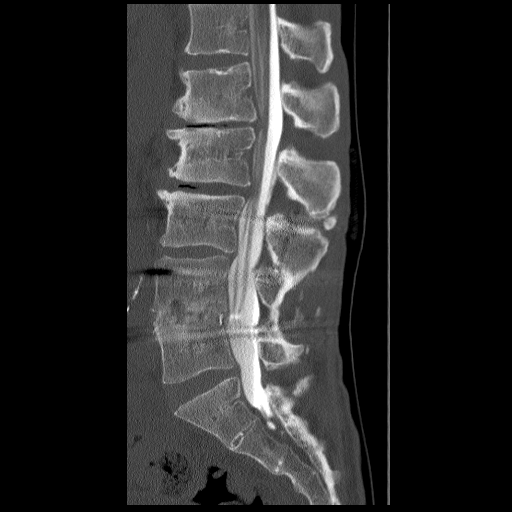
[im 40/60  bone]
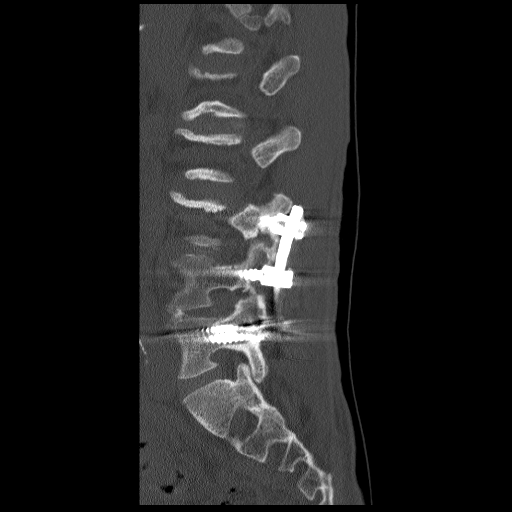
[im 50/60  bone]
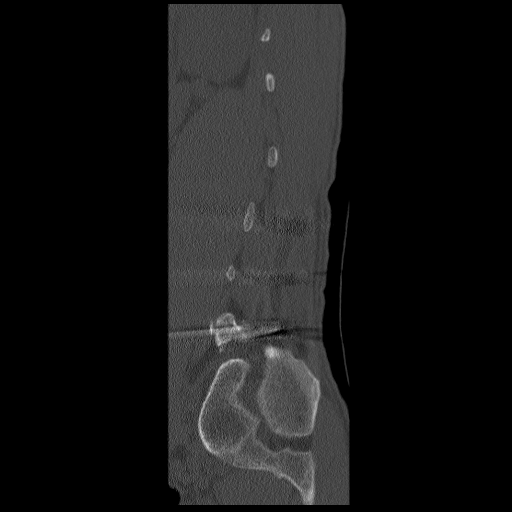

[11 of 33 positions shown; findings below may reference images not displayed]

EXAM:
LUMBAR MYELOGRAM

FLUOROSCOPY TIME:  0 min 52 seconds

PROCEDURE:
After thorough discussion of risks and benefits of the procedure
including bleeding, infection, injury to nerves, blood vessels,
adjacent structures as well as headache and CSF leak, written and
oral informed consent was obtained. Consent was obtained by Dr.
Hjrahman Delights. Time out form was completed.

Patient was positioned prone on the fluoroscopy table. Local
anesthesia was provided with 1% lidocaine without epinephrine after
prepped and draped in the usual sterile fashion. Puncture was
performed at L2-3 using a 3 1/2 inch 22-gauge spinal needle via a
left paramedian approach. Using a single pass through the dura, the
needle was placed within the thecal sac, with return of clear CSF.
15 mL of Mmnipaque-CB4 was injected into the thecal sac, with normal
opacification of the nerve roots and cauda equina consistent with
free flow within the subarachnoid space.

I personally performed the lumbar puncture and administered the
intrathecal contrast. I also personally supervised acquisition of
the myelogram images.
FINDINGS: LUMBAR MYELOGRAM FINDINGS:

Sequelae of L3-L5 posterior fusion and L4-5 interbody fusion are
again identified. Slight retrolisthesis is again seen of L2 on L3
and L3 on L4 measuring approximately 3 mm each without significant
change during flexion or extension. Small ventral extradural defects
are present at L1-2 and L2-3 without evidence of significant spinal
stenosis. Slight anterior wedging of the L1 vertebral body is noted.

CT LUMBAR MYELOGRAM FINDINGS:

Sequelae of L3-L5 posterior fusion are again identified. There is no
evidence of screw loosening or fracture. Prior L4-5 interbody fusion
is also again noted, and there has been progressive, solid osseous
fusion across the disc space. Solid posterolateral fusion masses are
also present from L3-L5.

Slight retrolisthesis of L2 on L3 and L3 and L4 are similar to the
prior study. Disc space narrowing is moderate to severe at L1-2 and
mild at L2-3 with vacuum disc phenomenon at both levels. The degree
of disc space height loss at these levels has slightly progressed
from the prior CT. There is also mild disc space narrowing at L3-4.
Endplate sclerosis and prominent anterior osteophytes are present
from L1-L3. Conus medullaris terminates at L1. Well defined lucent
lesion measuring 1.4 cm in the right ilium is unchanged. Right-sided
assimilation joint is noted at L5-S1. Atherosclerotic aortoiliac
calcification is partially visualized.

L1-2: Mild circumferential disc bulging without evidence of
significant spinal canal or neural foraminal stenosis, similar to
the prior study. The right L1 lamina and inferior facet are
hypoplastic.

L2-3: Mild disc bulging and facet hypertrophy without stenosis,
similar to prior.

L3-4: Prior posterior fusion. Mild disc bulging, endplate spurring,
and posterior element osseous overgrowth result in moderate
bilateral neural foraminal stenosis, not significantly changed. No
spinal stenosis.

L4-5: Prior posterior decompression and fusion. Spinal canal is
widely patent. Endplate and facet spurring result in mild left
greater the right neural foraminal stenosis, similar to prior.

L5-S1: Shallow central disc protrusion and mild facet arthrosis
without stenosis, unchanged.
IMPRESSION: 1. Slightly increased disc degeneration at L1-2 and L2-3 without
significant stenosis.
2. Unchanged, moderate bilateral neural foraminal stenosis at L3-4.
3. Solid L4-5 interbody fusion and solid L3-L5 posterior fusion.

## 2018-08-19 ENCOUNTER — Telehealth (INDEPENDENT_AMBULATORY_CARE_PROVIDER_SITE_OTHER): Payer: Self-pay | Admitting: Orthopaedic Surgery

## 2018-08-19 NOTE — Telephone Encounter (Signed)
Please advise. You last saw patient in 2016.

## 2018-08-19 NOTE — Telephone Encounter (Signed)
Noted. I tried to reach patient as well. Will wait for return call from patient to advise.

## 2018-08-19 NOTE — Telephone Encounter (Signed)
I saw him once in 2015 and once in 2016. We do not do that kind of note for any patients.  He applies for disability on his own. They get copies of all his doctor visits including his back surgeries by  Dr. Alveda Reasons and review them . I tried calling him times 3 . No voice mail set up

## 2018-08-19 NOTE — Telephone Encounter (Signed)
Pt called asking if he can have a note for social security for his disability.  Pt wanted to know if he needed a new apt to get a new note or how he needed to go about the situation.  Pt # [phone]

## 2018-09-05 ENCOUNTER — Telehealth (INDEPENDENT_AMBULATORY_CARE_PROVIDER_SITE_OTHER): Payer: Self-pay | Admitting: Radiology

## 2018-09-05 NOTE — Telephone Encounter (Signed)
I called patient to confirm appointment with Dr. Ophelia Charter on 09/06/2018 at 0945.  Home number is not a working number. Voicemail is not set up on mobile number and no answer.  If patient returns call, please confirm appointment and review COVID-19 screening questions.

## 2018-09-06 ENCOUNTER — Ambulatory Visit (INDEPENDENT_AMBULATORY_CARE_PROVIDER_SITE_OTHER): Payer: Self-pay | Admitting: Orthopaedic Surgery

## 2020-01-30 ENCOUNTER — Ambulatory Visit: Payer: Self-pay | Attending: Internal Medicine

## 2020-01-30 DIAGNOSIS — Z23 Encounter for immunization: Secondary | ICD-10-CM

## 2020-01-30 NOTE — Progress Notes (Signed)
   Covid-19 Vaccination Clinic  Name:  Dunbar Buras    MRN: [id number] DOB: Nov 01, 1960  01/30/2020  Mr. Dupin was observed post Covid-19 immunization for 15 minutes without incident. He was provided with Vaccine Information Sheet and instruction to access the V-Safe system.   Mr. Mano was instructed to call 911 with any severe reactions post vaccine: Marland Kitchen Difficulty breathing  . Swelling of face and throat  . A fast heartbeat  . A bad rash all over body  . Dizziness and weakness   Immunizations Administered    Name Date Dose VIS Date Route   Pfizer COVID-19 Vaccine 01/30/2020 12:02 PM 0.3 mL 07/26/2018 Intramuscular   Manufacturer: [employer], [employer]   Lot: [id number]   NDC: [address]-7

## 2020-02-20 ENCOUNTER — Ambulatory Visit: Payer: Self-pay | Attending: Internal Medicine

## 2020-02-20 DIAGNOSIS — Z23 Encounter for immunization: Secondary | ICD-10-CM

## 2020-02-20 NOTE — Progress Notes (Signed)
Patient refused to stay in observation for 15 minutes as encouraged, stated he needed to leave and had 12 minutes remaining. Patient was encouraged to seek help immediately if he experienced any severe symptoms.

## 2020-07-12 ENCOUNTER — Ambulatory Visit: Payer: Medicare Other | Admitting: Family Medicine

## 2020-09-17 ENCOUNTER — Ambulatory Visit: Payer: Medicare Other | Admitting: Orthopaedic Surgery

## 2021-02-26 ENCOUNTER — Telehealth: Payer: Self-pay | Admitting: Orthopaedic Surgery

## 2021-02-26 NOTE — Telephone Encounter (Signed)
I called patient. Appt made.

## 2021-02-26 NOTE — Telephone Encounter (Signed)
Pt has not been seen here for a few years but has been told he has scoliosis or degenerative discs by another provider. He previous saw Dr. Ophelia Charter and wanted a call back verifying from [facility] nurse that [facility] can give him a definitive answer if it is either one of those. Pt waiting for call back from nurse before making an appt. The best call back number for pt is [phone].

## 2021-03-11 ENCOUNTER — Ambulatory Visit: Payer: Medicare Other | Admitting: Orthopaedic Surgery

## 2023-09-23 ENCOUNTER — Ambulatory Visit: Admit: 2023-09-23 | Admitting: Orthopedic Surgery

## 2023-09-23 SURGERY — INSERTION, SPINAL CORD STIMULATOR, LUMBAR
Anesthesia: General
